# Patient Record
Sex: Female | Born: 1970 | Hispanic: No | Marital: Single | Smoking: Never smoker
Health system: Southern US, Community
[De-identification: ages and names within clinical notes are randomized; demographics above are authoritative.]

## PROBLEM LIST (undated history)

## (undated) DIAGNOSIS — C189 Malignant neoplasm of colon, unspecified: Secondary | ICD-10-CM

## (undated) DIAGNOSIS — I10 Essential (primary) hypertension: Secondary | ICD-10-CM

## (undated) HISTORY — DX: Malignant neoplasm of colon, unspecified: C18.9

## (undated) HISTORY — PX: CHOLECYSTECTOMY: SHX55

## (undated) HISTORY — PX: ABDOMINAL HYSTERECTOMY: SHX81

---

## 2009-12-10 ENCOUNTER — Ambulatory Visit (HOSPITAL_COMMUNITY): Admission: RE | Admit: 2009-12-10 | Discharge: 2009-12-10 | Payer: Self-pay | Admitting: Obstetrics and Gynecology

## 2009-12-31 ENCOUNTER — Ambulatory Visit (HOSPITAL_COMMUNITY): Admission: RE | Admit: 2009-12-31 | Discharge: 2009-12-31 | Payer: Self-pay | Admitting: Obstetrics and Gynecology

## 2010-01-22 ENCOUNTER — Ambulatory Visit (HOSPITAL_COMMUNITY): Admission: RE | Admit: 2010-01-22 | Discharge: 2010-01-22 | Payer: Self-pay | Admitting: Obstetrics and Gynecology

## 2010-03-05 ENCOUNTER — Ambulatory Visit (HOSPITAL_COMMUNITY): Admission: RE | Admit: 2010-03-05 | Discharge: 2010-03-05 | Payer: Self-pay | Admitting: Obstetrics and Gynecology

## 2010-04-02 ENCOUNTER — Ambulatory Visit (HOSPITAL_COMMUNITY): Admission: RE | Admit: 2010-04-02 | Discharge: 2010-04-02 | Payer: Self-pay | Admitting: Obstetrics and Gynecology

## 2010-04-30 ENCOUNTER — Ambulatory Visit (HOSPITAL_COMMUNITY): Admission: RE | Admit: 2010-04-30 | Discharge: 2010-04-30 | Payer: Self-pay | Admitting: Obstetrics and Gynecology

## 2010-05-27 ENCOUNTER — Ambulatory Visit (HOSPITAL_COMMUNITY): Admission: RE | Admit: 2010-05-27 | Discharge: 2010-05-27 | Payer: Self-pay | Admitting: Obstetrics and Gynecology

## 2010-06-06 ENCOUNTER — Ambulatory Visit (HOSPITAL_COMMUNITY): Admission: RE | Admit: 2010-06-06 | Discharge: 2010-06-06 | Payer: Self-pay | Admitting: Obstetrics and Gynecology

## 2010-11-17 ENCOUNTER — Encounter: Payer: Self-pay | Admitting: Obstetrics and Gynecology

## 2010-12-02 IMAGING — US US OB FOLLOW-UP
1 series · 14 of 28 positions shown · non-contrast
Comparison: none

OBSTETRICAL ULTRASOUND:
 This ultrasound was performed in The [HOSPITAL], and the [facility] report will be stored to [REDACTED] PACS.  This report is also available in [HOSPITAL]?s accessANYware.

[Series 1: us ob follow-up · 14 of 44 slices shown]
[im 2/44]
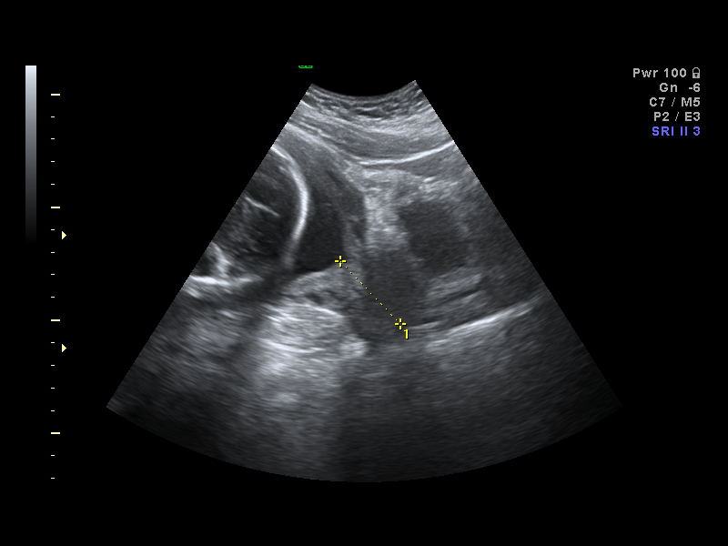
[im 5/44]
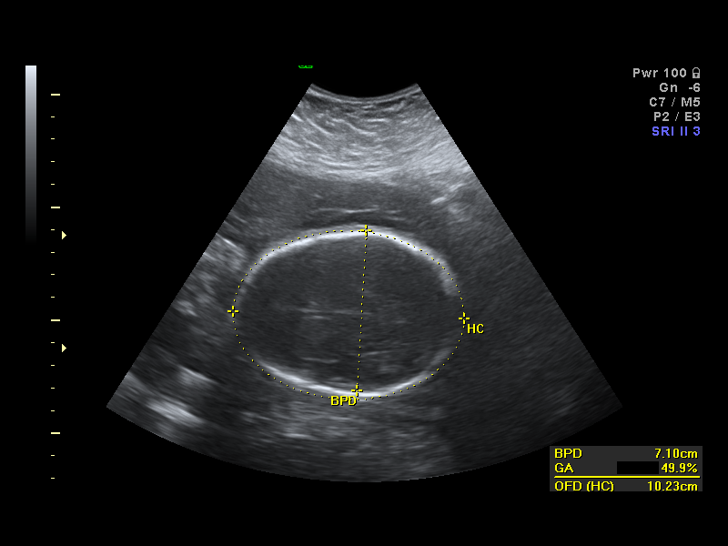
[im 8/44]
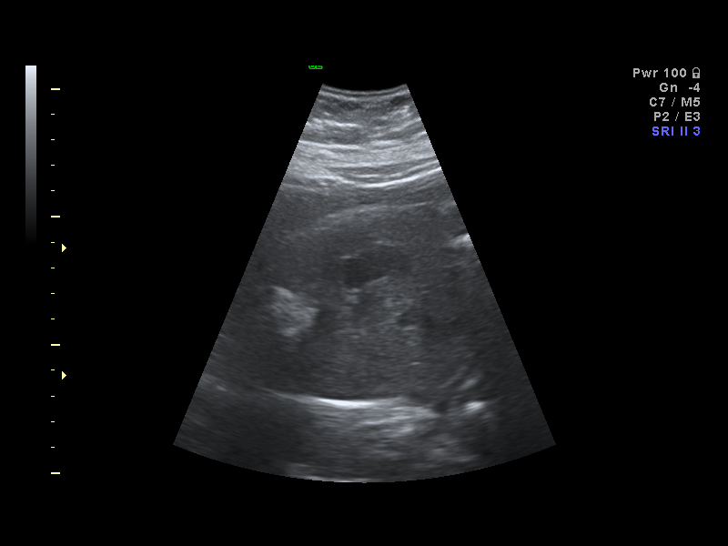
[im 12/44]
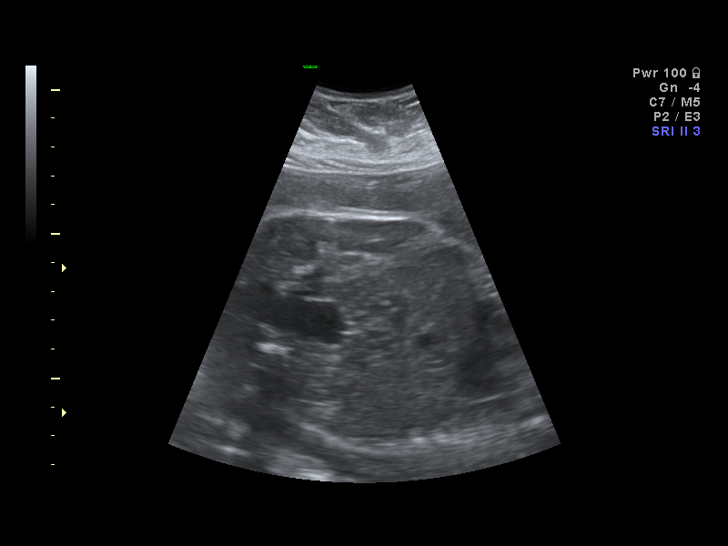
[im 15/44]
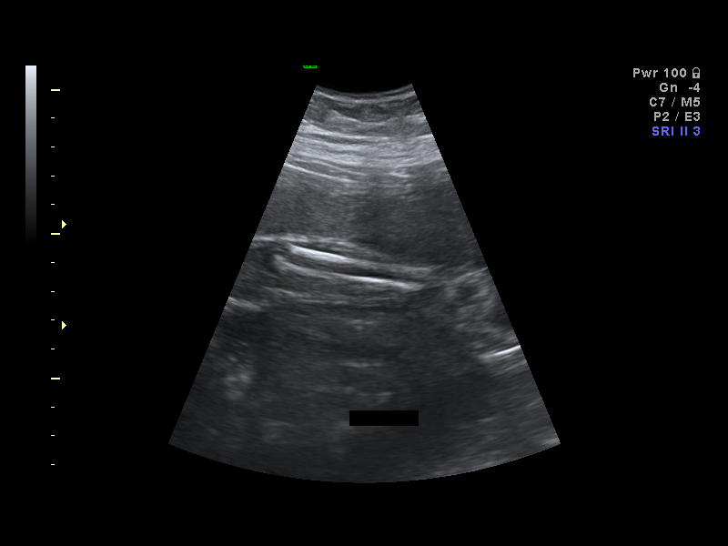
[im 18/44]
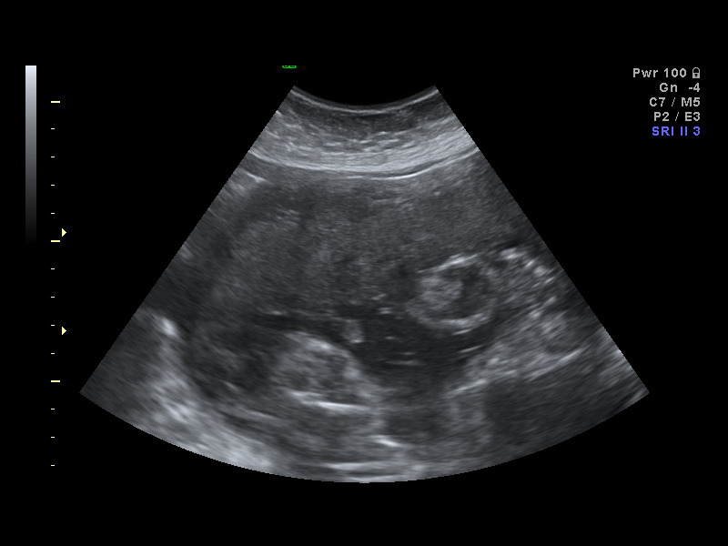
[im 21/44]
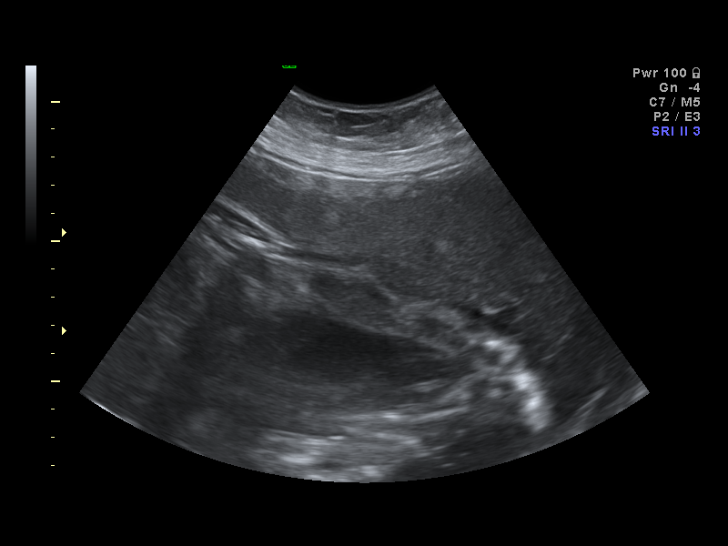
[im 24/44]
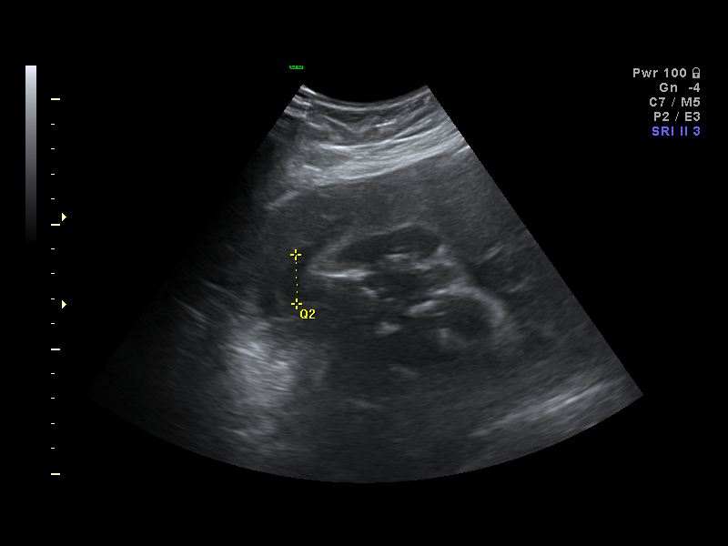
[im 28/44]
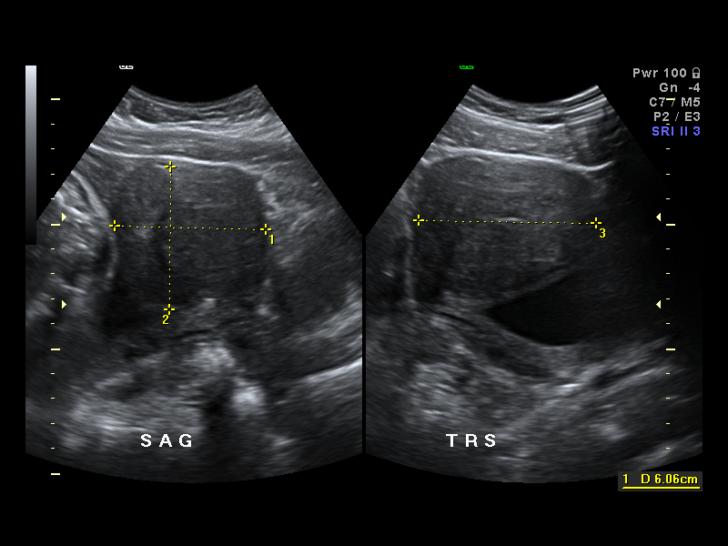
[im 31/44]
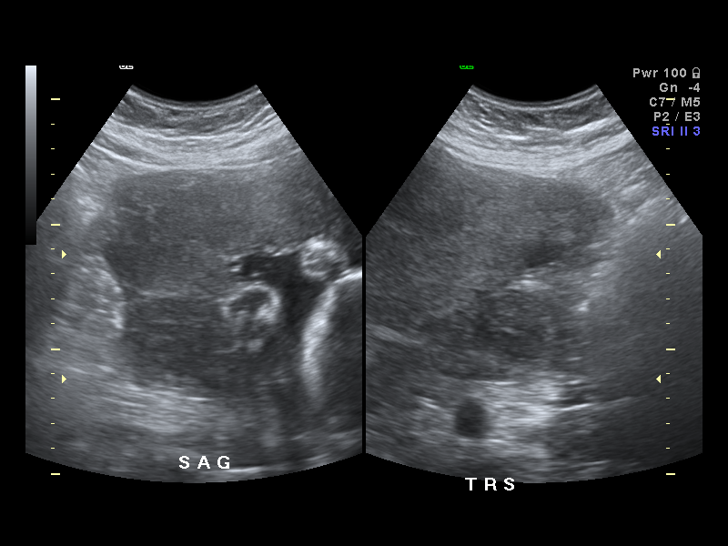
[im 34/44]
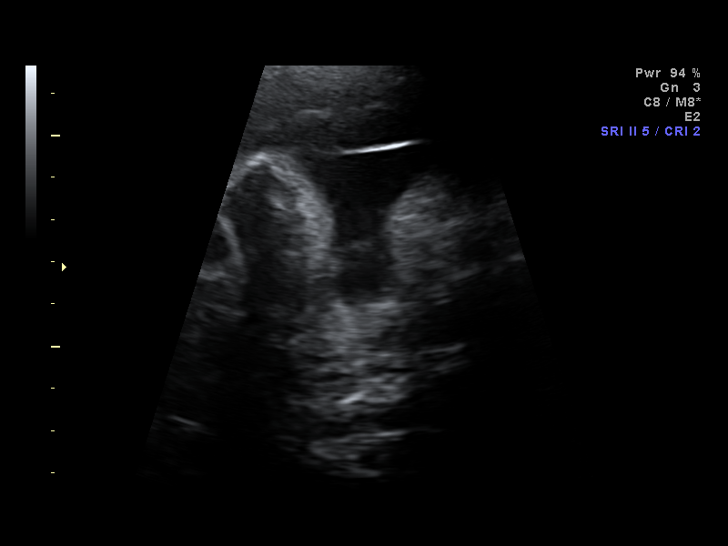
[im 37/44]
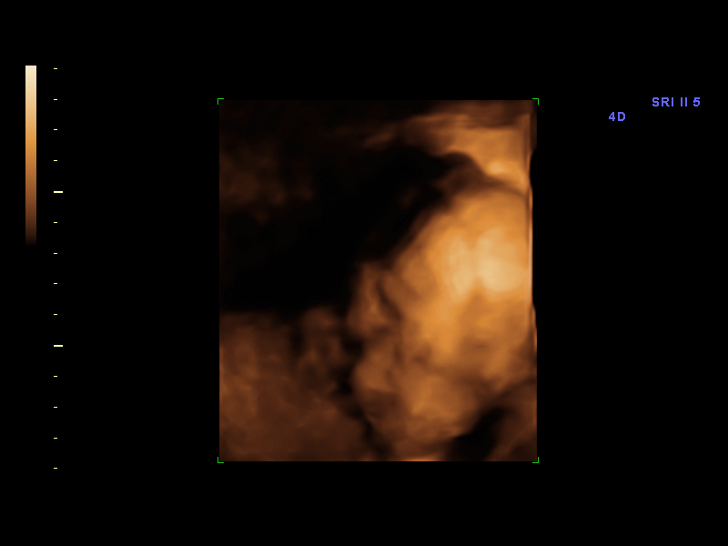
[im 40/44]
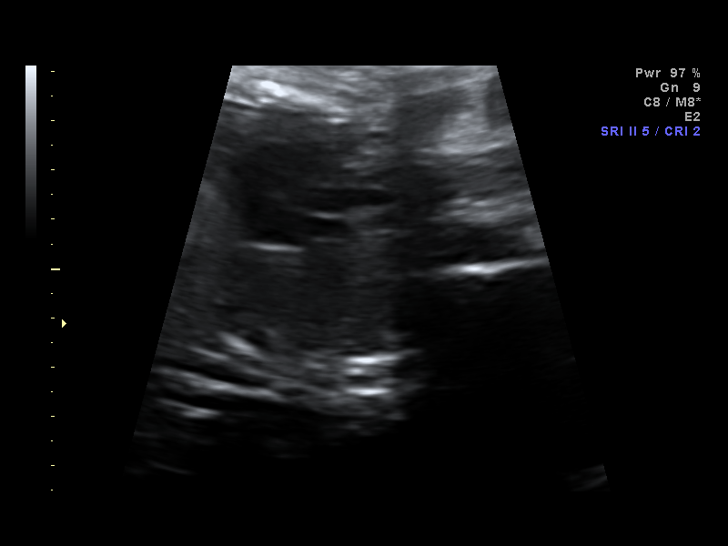
[im 44/44]
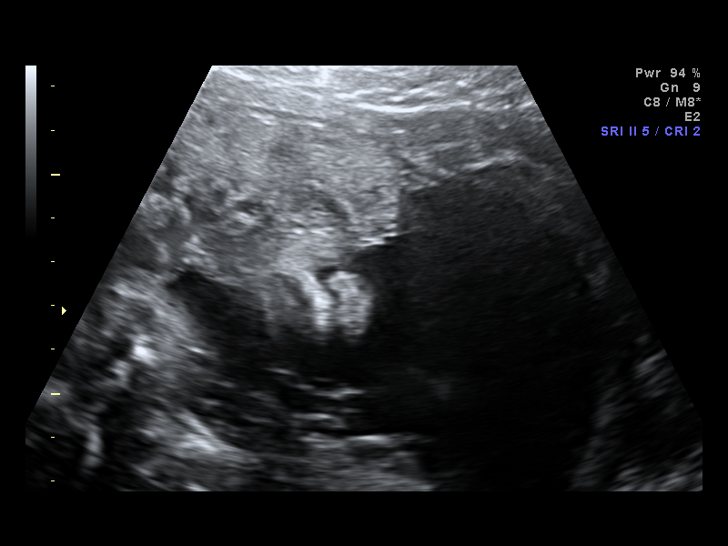

[14 of 28 positions shown; findings below may reference images not displayed]

IMPRESSION: [facility] has also been faxed to the ordering physician.

## 2014-06-11 ENCOUNTER — Emergency Department (HOSPITAL_BASED_OUTPATIENT_CLINIC_OR_DEPARTMENT_OTHER)
Admission: EM | Admit: 2014-06-11 | Discharge: 2014-06-11 | Disposition: A | Discharge status: Home / Self Care | Payer: Managed Care, Other (non HMO) | Attending: Emergency Medicine | Admitting: Emergency Medicine

## 2014-06-11 ENCOUNTER — Emergency Department (HOSPITAL_BASED_OUTPATIENT_CLINIC_OR_DEPARTMENT_OTHER): Payer: Managed Care, Other (non HMO)

## 2014-06-11 ENCOUNTER — Encounter (HOSPITAL_BASED_OUTPATIENT_CLINIC_OR_DEPARTMENT_OTHER): Payer: Self-pay | Admitting: Emergency Medicine

## 2014-06-11 DIAGNOSIS — M549 Dorsalgia, unspecified: Secondary | ICD-10-CM | POA: Insufficient documentation

## 2014-06-11 DIAGNOSIS — R079 Chest pain, unspecified: Secondary | ICD-10-CM | POA: Diagnosis present

## 2014-06-11 DIAGNOSIS — I10 Essential (primary) hypertension: Secondary | ICD-10-CM | POA: Insufficient documentation

## 2014-06-11 DIAGNOSIS — Z79899 Other long term (current) drug therapy: Secondary | ICD-10-CM | POA: Diagnosis not present

## 2014-06-11 DIAGNOSIS — R0789 Other chest pain: Secondary | ICD-10-CM

## 2014-06-11 DIAGNOSIS — R071 Chest pain on breathing: Secondary | ICD-10-CM | POA: Insufficient documentation

## 2014-06-11 HISTORY — DX: Essential (primary) hypertension: I10

## 2014-06-11 LAB — TROPONIN I

## 2014-06-11 LAB — BASIC METABOLIC PANEL
Anion gap: 9 (ref 5–15)
BUN: 12 mg/dL (ref 6–23)
CO2: 30 meq/L (ref 19–32)
Calcium: 9.3 mg/dL (ref 8.4–10.5)
Chloride: 102 mEq/L (ref 96–112)
Creatinine, Ser: 0.9 mg/dL (ref 0.50–1.10)
GFR, EST NON AFRICAN AMERICAN: 79 mL/min — AB (ref >90)
GLUCOSE: 99 mg/dL (ref 70–99)
POTASSIUM: 3.5 meq/L — AB (ref 3.7–5.3)
SODIUM: 141 meq/L (ref 137–147)

## 2014-06-11 LAB — CBC WITH DIFFERENTIAL/PLATELET
BASOS ABS: 0 10*3/uL (ref 0.0–0.1)
BASOS PCT: 0 % (ref 0–1)
Eosinophils Absolute: 0.1 10*3/uL (ref 0.0–0.7)
Eosinophils Relative: 2 % (ref 0–5)
HEMATOCRIT: 37 % (ref 36.0–46.0)
HEMOGLOBIN: 11.9 g/dL — AB (ref 12.0–15.0)
LYMPHS ABS: 2 10*3/uL (ref 0.7–4.0)
Lymphocytes Relative: 38 % (ref 12–46)
MCH: 25.3 pg — ABNORMAL LOW (ref 26.0–34.0)
MCHC: 32.2 g/dL (ref 30.0–36.0)
MCV: 78.7 fL (ref 78.0–100.0)
Monocytes Absolute: 0.5 10*3/uL (ref 0.1–1.0)
Monocytes Relative: 10 % (ref 3–12)
NEUTROS ABS: 2.6 10*3/uL (ref 1.7–7.7)
NEUTROS PCT: 50 % (ref 43–77)
PLATELETS: 232 10*3/uL (ref 150–400)
RBC: 4.7 MIL/uL (ref 3.87–5.11)
RDW: 15.4 % (ref 11.5–15.5)
WBC: 5.2 10*3/uL (ref 4.0–10.5)

## 2014-06-11 NOTE — ED Provider Notes (Signed)
CSN: [id number]     Arrival date & time 06/11/14  1545 History  This chart was scribed for Orlie Dakin, MD by Cathie Hoops, ED Scribe. The patient was seen in [place]. The patient's care was started at 6:13 PM.    Chief Complaint  Patient presents with  . Chest Pain   Patient is a 43 y.o. female presenting with chest pain. The history is provided by the patient. No language interpreter was used.  Chest Pain Pain location:  R chest Pain quality: sharp   Pain radiates to:  Upper back Pain radiates to the back: yes   Duration:  8 hours Progression:  Partially resolved Chronicity:  New Worsened by:  Movement Associated symptoms: back pain   Associated symptoms: no shortness of breath    HPI Comments: Jenna Wilkerson is a 43 y.o. female who presents to the Emergency Department complaining of new, moderate, partially resolved right chest pain onset 8 hours ago. Pt describes her pain as sharp. Pt reports her pain radiated to her back but is now almost resolved. Discomfort is minimal at present. Only when she moves her right arm. Pt reports her pain is worsened with movement. Pt reports her pain is decreased when she lays down. Pt reports her pain started when she woke up 8 hours ago. Pt denies taking any medication for her symptoms. Pt denies past medical history or family history of heart disease. Pt denies being a smoker. Pt reports she is a social drinker. Pt denies illicit drug use.   Pt states she has HTN. Pt reports she Pt states she sees Dr. Loistine Simas from [facility]. Pt reports she had a hysterectomy in 2011. Pt denies needs any medications for her pain.   Past Medical History  Diagnosis Date  . Hypertension    Past Surgical History  Procedure Laterality Date  . Cholecystectomy    . Abdominal hysterectomy     No family history on file. History  Substance Use Topics  . Smoking status: Never Smoker   . Smokeless tobacco: Not on file  . Alcohol Use: Yes     Comment:  social   OB History   Grav Para Term Preterm Abortions TAB SAB Ect Mult Living                 Review of Systems  Constitutional: Negative.   HENT: Negative.   Respiratory: Negative.  Negative for shortness of breath.   Cardiovascular: Positive for chest pain.  Gastrointestinal: Negative.   Musculoskeletal: Positive for back pain.  Skin: Negative.   Neurological: Negative.   Psychiatric/Behavioral: Negative.   All other systems reviewed and are negative.     Allergies  Review of patient's allergies indicates no known allergies.  Home Medications   Prior to Admission medications   Medication Sig Start Date End Date Taking? Authorizing Provider  metoprolol tartrate (LOPRESSOR) 25 MG tablet Take 25 mg by mouth 2 (two) times daily.   Yes Historical Provider, MD   Triage Vitals: BP 170/104  Pulse 82  Temp(Src) 98.4 F (36.9 C) (Oral)  Resp 18  Ht 5' 8.5" (1.74 m)  Wt 258 lb (117.028 kg)  BMI 38.65 kg/m2  SpO2 100% Physical Exam  Nursing note and vitals reviewed. Constitutional: She appears well-developed and well-nourished.  HENT:  Head: Normocephalic and atraumatic.  Eyes: Conjunctivae are normal. Pupils are equal, round, and reactive to light.  Neck: Neck supple. No tracheal deviation present. No thyromegaly present.  Cardiovascular: Normal rate, regular rhythm  and normal heart sounds.   No murmur heard. Pulmonary/Chest: Effort normal and breath sounds normal.  Patient has pain at right anterior chest upon forcible flexion of right shoulder. She is also mildly tender at right chest wall anteriorly  Abdominal: Soft. Bowel sounds are normal. She exhibits no distension. There is no tenderness.  Obese  Musculoskeletal: Normal range of motion. She exhibits no edema and no tenderness.  Neurological: She is alert. Coordination normal.  Skin: Skin is warm and dry. No rash noted.  Psychiatric: She has a normal mood and affect.    ED Course  Procedures (including  critical care time) DIAGNOSTIC STUDIES: Oxygen Saturation is 100% on RA, nromal by my interpretation.    COORDINATION OF CARE: 6:18 PM- Patient informed of current plan for treatment and evaluation and agrees with plan at this time.    Labs Review Labs Reviewed  CBC WITH DIFFERENTIAL - Abnormal; Notable for the following:    Hemoglobin 11.9 (*)    MCH 25.3 (*)    All other components within normal limits  BASIC METABOLIC PANEL - Abnormal; Notable for the following:    Potassium 3.5 (*)    GFR calc non Af Amer 79 (*)    All other components within normal limits  TROPONIN I    Imaging Review No results found.  EKG Interpretation Date/Time:  Sunday June 11 2014 16:06:53 EDT Ventricular Rate:  81 PR Interval:  172 QRS Duration: 90 QT Interval:  372 QTC Calculation: 432 R Axis:   87 Text Interpretation:  Normal sinus rhythm Nonspecific T wave abnormality  Abnormal ECG No old tracing to compare Confirmed by Winfred Leeds  MD, Deavion Strider  [phone]) on 06/11/2014 4:38:08 PM Chest x-ray viewed by me Results for orders placed during the hospital encounter of 06/11/14  CBC WITH DIFFERENTIAL      Result Value Ref Range   WBC 5.2  4.0 - 10.5 K/uL   RBC 4.70  3.87 - 5.11 MIL/uL   Hemoglobin 11.9 (*) 12.0 - 15.0 g/dL   HCT 37.0  36.0 - 46.0 %   MCV 78.7  78.0 - 100.0 fL   MCH 25.3 (*) 26.0 - 34.0 pg   MCHC 32.2  30.0 - 36.0 g/dL   RDW 15.4  11.5 - 15.5 %   Platelets 232  150 - 400 K/uL   Neutrophils Relative % 50  43 - 77 %   Neutro Abs 2.6  1.7 - 7.7 K/uL   Lymphocytes Relative 38  12 - 46 %   Lymphs Abs 2.0  0.7 - 4.0 K/uL   Monocytes Relative 10  3 - 12 %   Monocytes Absolute 0.5  0.1 - 1.0 K/uL   Eosinophils Relative 2  0 - 5 %   Eosinophils Absolute 0.1  0.0 - 0.7 K/uL   Basophils Relative 0  0 - 1 %   Basophils Absolute 0.0  0.0 - 0.1 K/uL  BASIC METABOLIC PANEL      Result Value Ref Range   Sodium 141  137 - 147 mEq/L   Potassium 3.5 (*) 3.7 - 5.3 mEq/L   Chloride 102  96  - 112 mEq/L   CO2 30  19 - 32 mEq/L   Glucose, Bld 99  70 - 99 mg/dL   BUN 12  6 - 23 mg/dL   Creatinine, Ser 0.90  0.50 - 1.10 mg/dL   Calcium 9.3  8.4 - 10.5 mg/dL   GFR calc non Af Amer 79 (*) >90  mL/min   GFR calc Af Amer >90  >90 mL/min   Anion gap 9  5 - 15  TROPONIN I      Result Value Ref Range   Troponin I <0.30  <0.30 ng/mL   Dg Chest 2 View  06/11/2014   CLINICAL DATA:  Shortness of breath. Right side chest pain. Symptoms for 8 hr.  EXAM: CHEST  2 VIEW  COMPARISON:  None.  FINDINGS: Heart size and mediastinal contours are within normal limits. Both lungs are clear. Visualized skeletal structures are unremarkable.  IMPRESSION: Negative exam.   Electronically Signed   By: Inge Rise M.D.   On: 06/11/2014 18:37     6:55 PM patient resting comfortably MDM  PERC negative heart score 2 symptoms high be atypical for acute coronary syndrome. Negative cardiac markers after several hours of symptoms. Final diagnoses:  None   exam is most consistent with musculoskeletal chest pain Plan Tylenol for pain blood pressure recheck 3 weeks Diagnosis #1 chest wall pain #2 hypertension    I personally performed the services described in this documentation, which was scribed in my presence. The recorded information has been reviewed and considered.    Orlie Dakin, MD 06/11/14 1900

## 2014-06-11 NOTE — ED Notes (Signed)
Patient states thhis morning she had a sharp pain in her upper right chest that radiated to her back. Pain is resolved, but now the area is tender and sore. Describes it has worsening with movement.

## 2014-06-11 NOTE — Discharge Instructions (Signed)
Chest Wall Pain Take Tylenol directed for pain. Get Your blood pressure recheck within the next 3 weeks. Today's is mildly elevated at 158/97 Chest wall pain is pain in or around the bones and muscles of your chest. It may take up to 6 weeks to get better. It may take longer if you must stay physically active in your work and activities.  CAUSES  Chest wall pain may happen on its own. However, it may be caused by:  A viral illness like the flu.  Injury.  Coughing.  Exercise.  Arthritis.  Fibromyalgia.  Shingles. HOME CARE INSTRUCTIONS   Avoid overtiring physical activity. Try not to strain or perform activities that cause pain. This includes any activities using your chest or your abdominal and side muscles, especially if heavy weights are used.  Put ice on the sore area.  Put ice in a plastic bag.  Place a towel between your skin and the bag.  Leave the ice on for 15-20 minutes per hour while awake for the first 2 days.  Only take over-the-counter or prescription medicines for pain, discomfort, or fever as directed by your caregiver. SEEK IMMEDIATE MEDICAL CARE IF:   Your pain increases, or you are very uncomfortable.  You have a fever.  Your chest pain becomes worse.  You have new, unexplained symptoms.  You have nausea or vomiting.  You feel sweaty or lightheaded.  You have a cough with phlegm (sputum), or you cough up blood. MAKE SURE YOU:   Understand these instructions.  Will watch your condition.  Will get help right away if you are not doing well or get worse. Document Released: 10/13/2005 Document Revised: 01/05/2012 Document Reviewed: 06/09/2011 [facility] Patient Information 2015 [place], [place]. This information is not intended to replace advice given to you by your health care provider. Make sure you discuss any questions you have with your health care provider.

## 2016-03-21 ENCOUNTER — Ambulatory Visit: Payer: Managed Care, Other (non HMO) | Attending: Gynecologic Oncology | Admitting: Gynecologic Oncology

## 2016-03-21 ENCOUNTER — Encounter: Payer: Self-pay | Admitting: Gynecologic Oncology

## 2016-03-21 VITALS — BP 151/86 | HR 86 | Temp 98.7°F | Resp 18 | Ht 69.0 in | Wt 252.3 lb

## 2016-03-21 DIAGNOSIS — Z800 Family history of malignant neoplasm of digestive organs: Secondary | ICD-10-CM | POA: Diagnosis not present

## 2016-03-21 DIAGNOSIS — R1900 Intra-abdominal and pelvic swelling, mass and lump, unspecified site: Secondary | ICD-10-CM | POA: Diagnosis not present

## 2016-03-21 DIAGNOSIS — Z9049 Acquired absence of other specified parts of digestive tract: Secondary | ICD-10-CM | POA: Insufficient documentation

## 2016-03-21 DIAGNOSIS — Z85048 Personal history of other malignant neoplasm of rectum, rectosigmoid junction, and anus: Secondary | ICD-10-CM | POA: Diagnosis not present

## 2016-03-21 DIAGNOSIS — I10 Essential (primary) hypertension: Secondary | ICD-10-CM | POA: Diagnosis not present

## 2016-03-21 DIAGNOSIS — Z90710 Acquired absence of both cervix and uterus: Secondary | ICD-10-CM | POA: Diagnosis not present

## 2016-03-21 DIAGNOSIS — Z85038 Personal history of other malignant neoplasm of large intestine: Secondary | ICD-10-CM | POA: Diagnosis not present

## 2016-03-21 NOTE — Progress Notes (Signed)
Consult Note: Gyn-Onc  Consult was requested by Dr. Bethann Goo for the evaluation of Jenna Wilkerson 45 y.o. female  CC:  Chief Complaint  Patient presents with  . New Patient (Initial Visit)    Pelvic Mass    Assessment/Plan:  Jenna Wilkerson  is a 45 y.o.  year old with an enlarging asymptomatic cystic fluid collection in the abdomen and pelvis one year after surgery for rectal cancer.  I have personally reviewed her CT images from December, 2016 and May, 2017.  I do not believe that the fluid collection represents a malignancy of the ovaries and fallopian tubes. I favor a benign peritoneal inclusion cyst or possibly hydrosalpinx. I particularly favor a loculation within the peritoneum given its irregular conformity to the internal space. Given that it is asymptomatic, I do not advocate for surgery as this may result in greater risk to the patient than what is represented by this fluid collection. However if it continues to significantly grow in size, it is reasonable to consider aspiration via radiological guidance.   HPI: Jenna Wilkerson is a 45 year old parous woman who is seen in consultation at the request of Dr Bethann Goo for a 18cm fluid collection in the pelvis in the setting of a recent diagnosis of rectal cancer.   After reviewing the patient's history and discussing it with her to P she had a hysterectomy for benign indications in 2012. Her ovaries and tubes remain in situ. However in December 2015 she developed a rectal cancer and underwent exploratory laparotomy with sigmoid hemicolectomy and appendectomy on February 15 3015. 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  Of interest the patient has undergone genetic assessment and her tumor it does not fit a profile for Lynch syndrome though she has a very striking family history for colon cancer in multiple  relatives.  As part of her post treatment surveillance she underwent a CT scan of the abdomen and pelvis in December 2016. This revealed no evidence of progressive or residual disease but the ovaries appeared to be encased within an amorphous irregular 7.8 x 7.7 x 8.1 cm thin walled fluid collection. CA-125 was ordered as part of the workup for this in January 2017 and was normal at 21 units per milliliter. Of note her CEA is also normal.  She had further workup for her rectal cancer in April 2017 which included colonoscopy which was unremarkable. A repeat CT scan of the abdomen and pelvis on 02/28/2016 revealed a 5.6 x 12.3 x 18.8 cm cystic lesion along the lower anterior abdomen and pelvis associated with the bilateral ovaries. It had increased in size from previous scan. The Hounsfield units was suggestive for simple fluid density but with some internal complexity. The lesion essentially conforms this running demonstrates some local mass effect.  She is otherwise doing well after treatment with her chemotherapy and has no other major health concerns.  Current Meds:  Outpatient Encounter Prescriptions as of 03/21/2016  Medication Sig  . amLODipine (NORVASC) 5 MG tablet Take 5 mg by mouth.  . DULoxetine (CYMBALTA) 20 MG capsule Take 20 mg by mouth.  Marland Kitchen lisinopril (PRINIVIL,ZESTRIL) 40 MG tablet Take 40 mg by mouth.  . metoprolol tartrate (LOPRESSOR) 25 MG tablet Take 25 mg by mouth 2 (two) times daily.  . potassium chloride SA (K-DUR,KLOR-CON) 20 MEQ tablet    No facility-administered encounter medications on file  as of 03/21/2016.    Allergy: No Known Allergies  Social Hx:   Social History   Social History  . Marital Status: Single    Spouse Name: N/A  . Number of Children: N/A  . Years of Education: N/A   Occupational History  . Not on file.   Social History Main Topics  . Smoking status: Never Smoker   . Smokeless tobacco: Not on file  . Alcohol Use: Yes     Comment: social  .  Drug Use: No  . Sexual Activity: Yes   Other Topics Concern  . Not on file   Social History Narrative    Past Surgical Hx:  Past Surgical History  Procedure Laterality Date  . Cholecystectomy    . Abdominal hysterectomy      Past Medical Hx:  Past Medical History  Diagnosis Date  . Hypertension   . Colon cancer ([facility]) diagnosis 02/04/2015    Past Gynecological History:  SVD x 2, TAH for benign indications in 2012  No LMP recorded. Patient has had a hysterectomy.  Family Hx:  Family History  Problem Relation Age of Onset  . Diabetes Mother   . Cancer Father     Review of Systems:  Constitutional  Feels well,    ENT Normal appearing ears and nares bilaterally Skin/Breast  No rash, sores, jaundice, itching, dryness Cardiovascular  No chest pain, shortness of breath, or edema  Pulmonary  No cough or wheeze.  Gastro Intestinal  No nausea, vomitting, or diarrhoea. No bright red blood per rectum, no abdominal pain, change in bowel movement, or constipation.  Genito Urinary  No frequency, urgency, dysuria,  Musculo Skeletal  No myalgia, arthralgia, joint swelling or pain  Neurologic  No weakness, numbness, change in gait,  Psychology  No depression, anxiety, insomnia.   Vitals:  Blood pressure 151/86, pulse 86, temperature 98.7 F (37.1 C), temperature source Oral, resp. rate 18, height 5\' 9"  (1.753 m), weight 252 lb 4.8 oz (114.443 kg), SpO2 100 %.  Physical Exam: WD in NAD Neck  Supple NROM, without any enlargements.  Lymph Node Survey No cervical supraclavicular or inguinal adenopathy Cardiovascular  Pulse normal rate, regularity and rhythm. S1 and S2 normal.  Lungs  Clear to auscultation bilateraly, without wheezes/crackles/rhonchi. Good air movement.  Skin  No rash/lesions/breakdown  Psychiatry  Alert and oriented to person, place, and time  Abdomen  Normoactive bowel sounds, abdomen soft, non-tender and obese without evidence of hernia. Well healed  incision Back No CVA tenderness Genito Urinary  Vulva/vagina: Normal external female genitalia.  No lesions. No discharge or bleeding.  Bladder/urethra:  No lesions or masses, well supported bladder  Vagina: normal  Cervix: surgically absent  Uterus: surgiclly absent   Adnexa: no palpable masses. Rectal  deferred Extremities  No bilateral cyanosis, clubbing or edema.   Donaciano Eva, MD  03/21/2016, 5:38 PM

## 2016-03-21 NOTE — Patient Instructions (Signed)
Follow up with Dr. Bethann Goo with imaging as planned for your cancer follow up.  Please call for any questions or concerns.
